# Patient Record
Sex: Male | Born: 1990 | Race: Black or African American | Hispanic: No | Marital: Single | Smoking: Never smoker
Health system: Southern US, Community
[De-identification: ages and names within clinical notes are randomized; demographics above are authoritative.]

## PROBLEM LIST (undated history)

## (undated) HISTORY — PX: KNEE SURGERY: SHX244

---

## 2014-03-13 ENCOUNTER — Emergency Department (HOSPITAL_COMMUNITY)
Admission: EM | Admit: 2014-03-13 | Discharge: 2014-03-13 | Disposition: A | Discharge status: Home / Self Care | Payer: BLUE CROSS/BLUE SHIELD | Source: Home / Self Care | Attending: Family Medicine | Admitting: Family Medicine

## 2014-03-13 ENCOUNTER — Emergency Department (INDEPENDENT_AMBULATORY_CARE_PROVIDER_SITE_OTHER): Payer: BLUE CROSS/BLUE SHIELD

## 2014-03-13 ENCOUNTER — Encounter (HOSPITAL_COMMUNITY): Payer: Self-pay

## 2014-03-13 DIAGNOSIS — T1490 Injury, unspecified: Secondary | ICD-10-CM

## 2014-03-13 DIAGNOSIS — S86812A Strain of other muscle(s) and tendon(s) at lower leg level, left leg, initial encounter: Secondary | ICD-10-CM

## 2014-03-13 DIAGNOSIS — T1490XA Injury, unspecified, initial encounter: Secondary | ICD-10-CM

## 2014-03-13 DIAGNOSIS — S86912A Strain of unspecified muscle(s) and tendon(s) at lower leg level, left leg, initial encounter: Secondary | ICD-10-CM

## 2014-03-13 MED ORDER — HYDROCODONE-ACETAMINOPHEN 5-325 MG PO TABS: 1.0000 | ORAL_TABLET | Freq: Four times a day (QID) | ORAL | Status: AC | PRN

## 2014-03-13 NOTE — ED Provider Notes (Signed)
CSN: [id number]     Arrival date & time 03/13/14  1656 History   First MD Initiated Contact with Patient 03/13/14 1712     Chief Complaint  Patient presents with  . Knee Injury   (Consider location/radiation/quality/duration/timing/severity/associated sxs/prior Treatment) Patient is a 24 y.o. male presenting with knee pain. The history is provided by the patient.  Knee Pain Location:  Knee Injury: yes   Mechanism of injury comment:  Injured jumping on trampoline yest. Knee location:  L knee Pain details:    Quality:  Sharp   Severity:  Moderate   Onset quality:  Sudden Chronicity:  New Dislocation: no   Associated symptoms: decreased ROM and swelling   Risk factors comment:  S/p left knee surg.for fx with screws.   History reviewed. No pertinent past medical history. Past Surgical History  Procedure Laterality Date  . Knee surgery     History reviewed. No pertinent family history. History  Substance Use Topics  . Smoking status: Never Smoker   . Smokeless tobacco: Not on file  . Alcohol Use: No    Review of Systems  Constitutional: Negative.   Musculoskeletal: Positive for joint swelling and gait problem.  Skin: Negative.     Allergies  Review of patient's allergies indicates no known allergies.  Home Medications   Prior to Admission medications   Medication Sig Start Date End Date Taking? Authorizing Provider  HYDROcodone-acetaminophen (NORCO/VICODIN) 5-325 MG per tablet Take 1 tablet by mouth every 6 (six) hours as needed for moderate pain or severe pain. 03/13/14   Linna HoffJames D Pegge Cumberledge, MD   BP 142/88 mmHg  Pulse 75  Temp(Src) 98.8 F (37.1 C) (Oral)  Resp 16  SpO2 99% Physical Exam  Constitutional: He is oriented to person, place, and time. He appears well-developed and well-nourished. No distress.  Musculoskeletal: He exhibits tenderness.       Left knee: He exhibits decreased range of motion, swelling, effusion and MCL laxity. He exhibits no deformity,  normal alignment, no LCL laxity, normal patellar mobility, no bony tenderness and normal meniscus. Tenderness found. Medial joint line tenderness noted. No lateral joint line and no patellar tendon tenderness noted.       Legs: Able to actively extend knee but pain and limitation of flexion at 90'.  Neurological: He is alert and oriented to person, place, and time.  Skin: Skin is warm and dry.  Nursing note and vitals reviewed.   ED Course  Procedures (including critical care time) Labs Review Labs Reviewed - No data to display  Imaging Review Dg Knee Complete 4 Views Left  03/13/2014   CLINICAL DATA:  Anterior knee pain with flexion following injury yesterday. Previous surgery.  EXAM: LEFT KNEE - COMPLETE 4+ VIEW  COMPARISON:  None.  FINDINGS: The mineralization and alignment are normal. There is no evidence of acute fracture or dislocation. There are postsurgical changes within the proximal tibia with 3 cannulated screws which are nondisplaced. There is fragmentation of the tibial tubercle which appears nonacute. The joint spaces are maintained. There is no significant joint effusion. There is a sclerotic lesion within the distal femoral diaphysis which has a nonaggressive appearance.  IMPRESSION: No acute osseous findings.  Postsurgical changes as described.   Electronically Signed   By: Roxy HorsemanBill  Veazey M.D.   On: 03/13/2014 17:33     MDM   1. Knee strain, left, initial encounter   2. Trauma        Linna HoffJames D Izzy Doubek, MD 03/13/14 [phone]

## 2014-03-13 NOTE — ED Notes (Signed)
Reportedly went to a trampoline party yesterday, and maneuver did not go as planned, injury to left knee resulted. History of prior injury w screws

## 2014-03-13 NOTE — Discharge Instructions (Signed)
Wear knee brace and activity as tolerated, see orthopedist for recheck.

## 2016-08-12 IMAGING — CR DG KNEE COMPLETE 4+V*L*
4 series · 4 of 4 positions shown · non-contrast
Comparison: None.

CLINICAL DATA: Anterior knee pain with flexion following injury
yesterday. Previous surgery.

EXAM:
LEFT KNEE - COMPLETE 4+ VIEW

[knee ap]
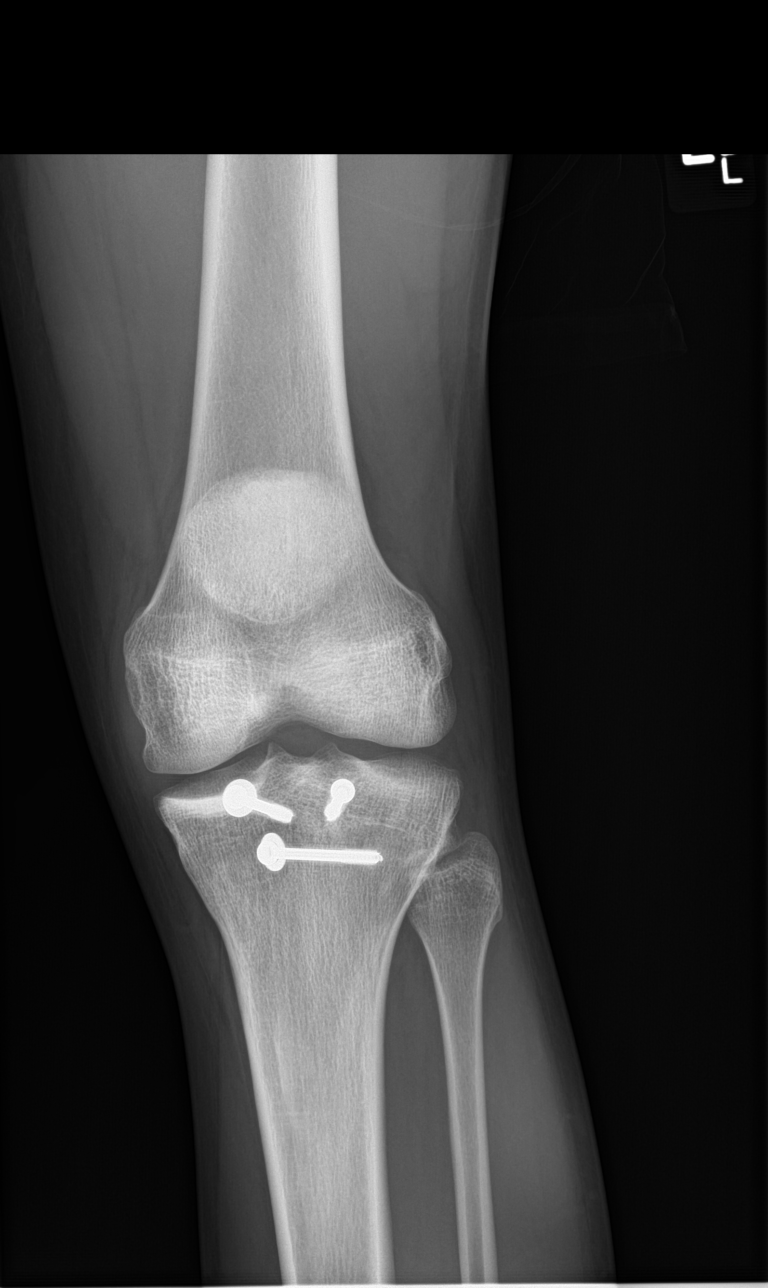

[knee lat]
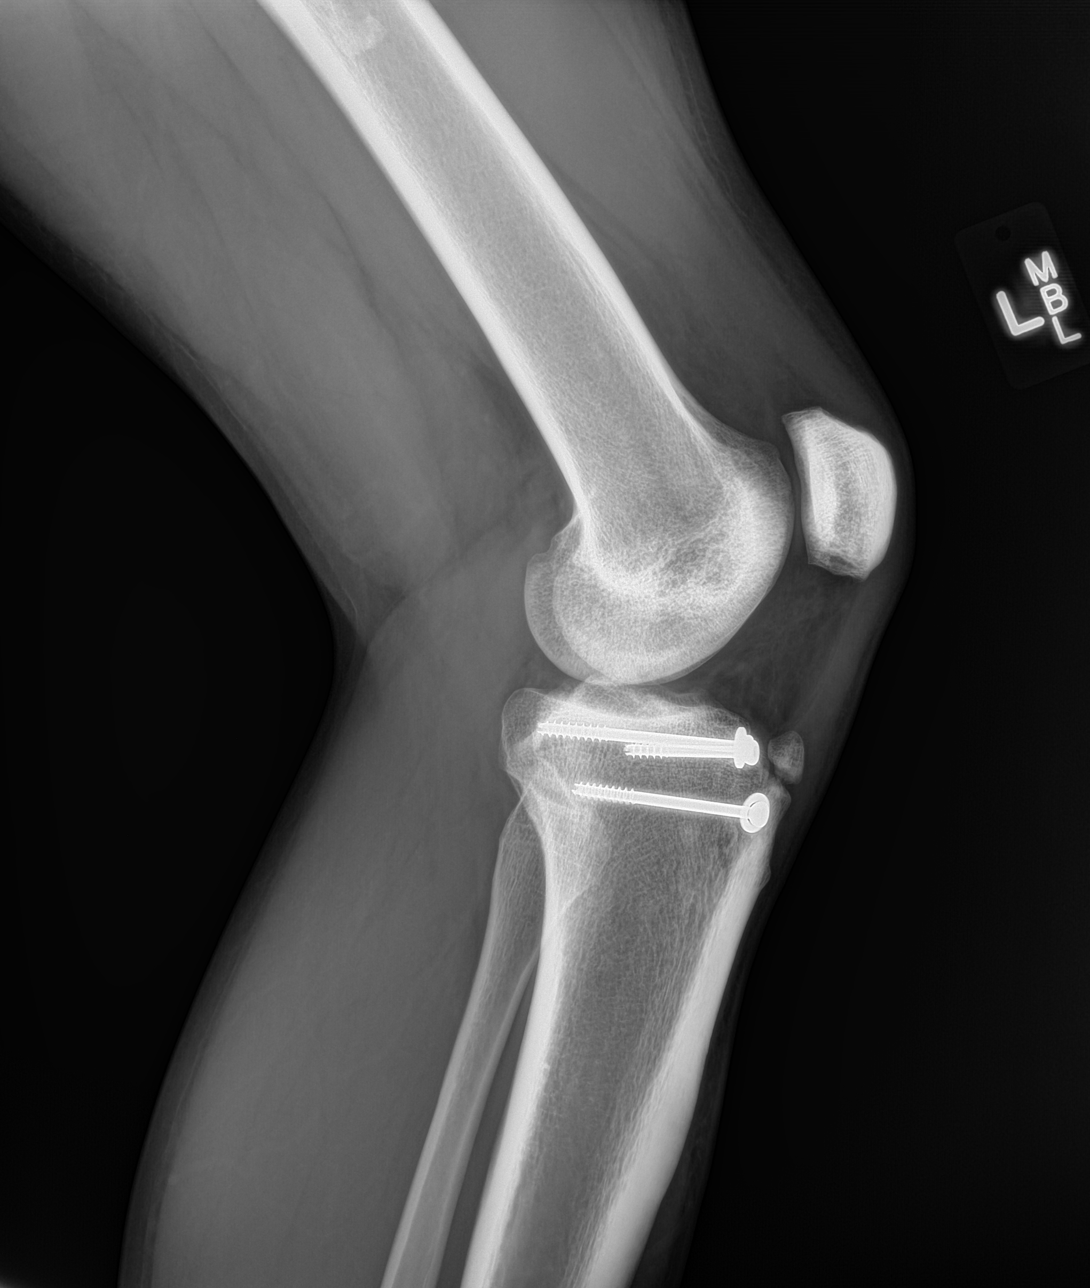

[knee obl (1 of 2)]
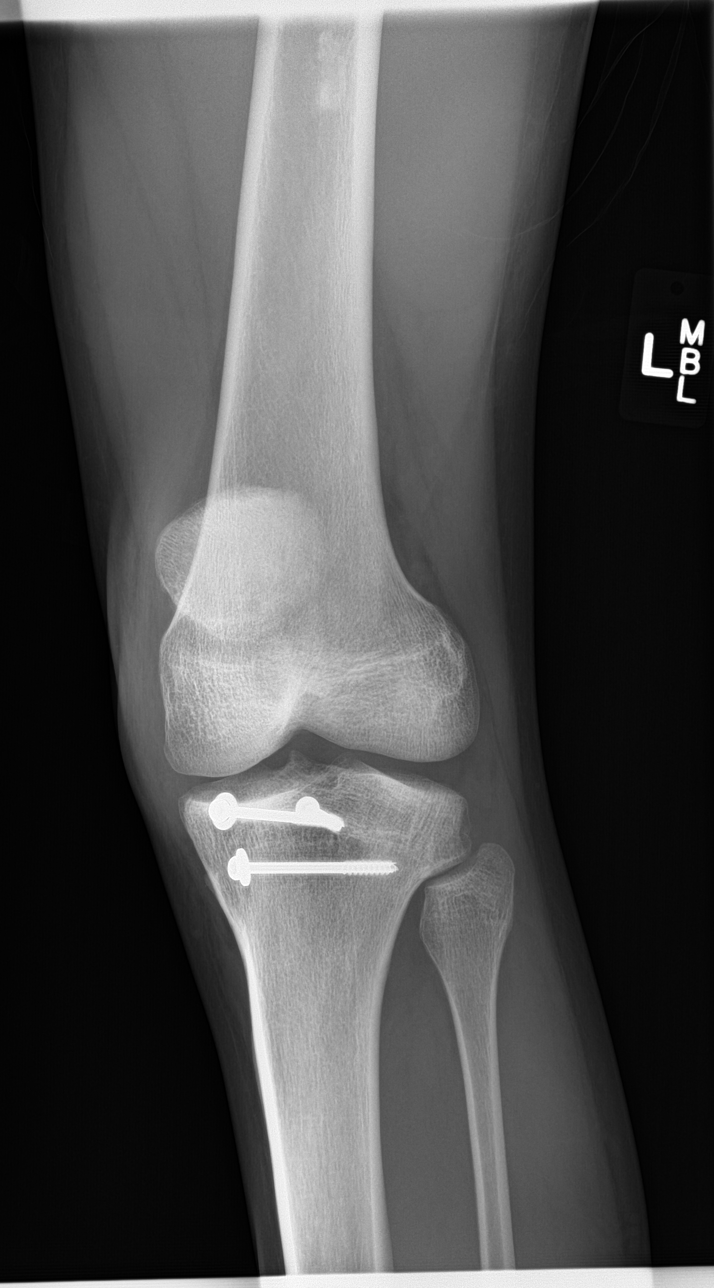

[knee obl (2 of 2)]
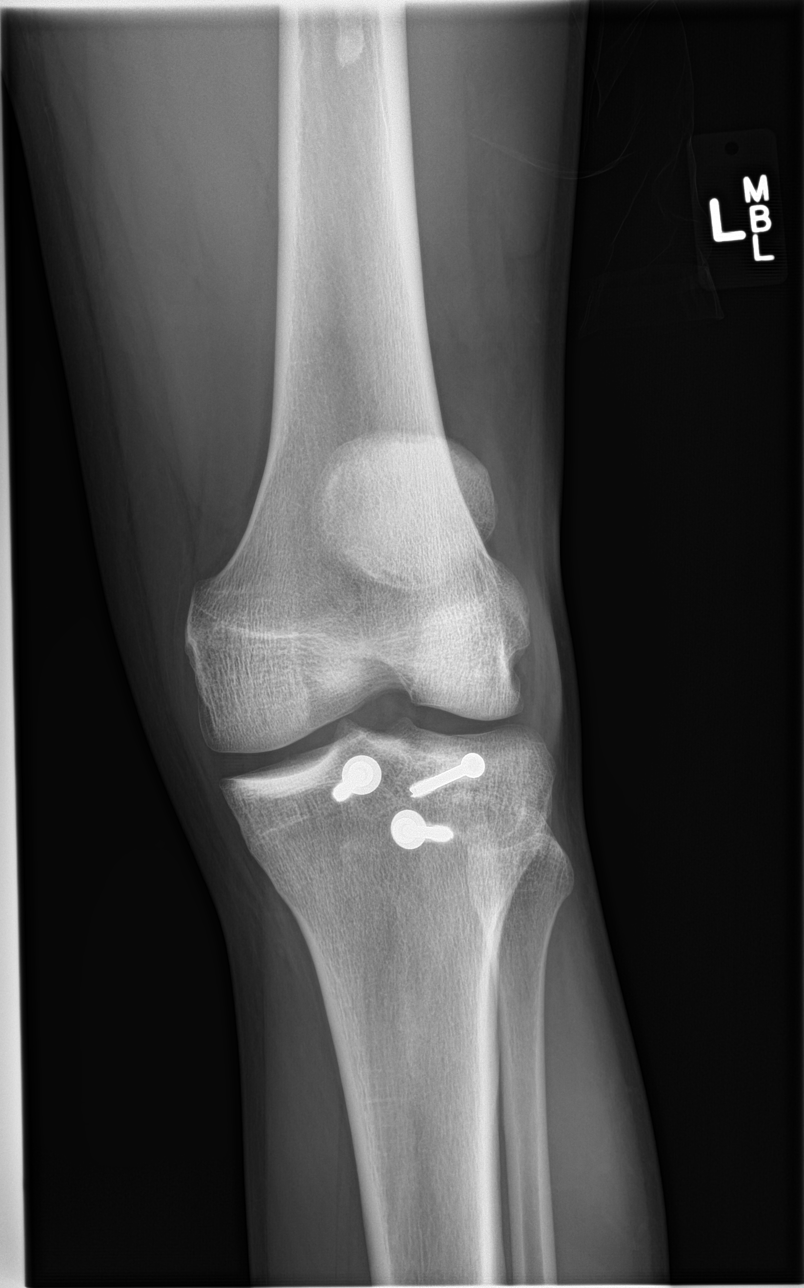

[4 of 4 positions shown; findings below may reference images not displayed]

FINDINGS: The mineralization and alignment are normal. There is no evidence of
acute fracture or dislocation. There are postsurgical changes within
the proximal tibia with 3 cannulated screws which are nondisplaced.
There is fragmentation of the tibial tubercle which appears
nonacute. The joint spaces are maintained. There is no significant
joint effusion. There is a sclerotic lesion within the distal
femoral diaphysis which has a nonaggressive appearance.
IMPRESSION: No acute osseous findings.  Postsurgical changes as described.
